# Patient Record
Sex: Male | Born: 1970 | Race: Black or African American | Hispanic: No | Marital: Married | Smoking: Never smoker
Health system: Southern US, Community
[De-identification: ages and names within clinical notes are randomized; demographics above are authoritative.]

## PROBLEM LIST (undated history)

## (undated) DIAGNOSIS — I10 Essential (primary) hypertension: Secondary | ICD-10-CM

## (undated) HISTORY — DX: Essential (primary) hypertension: I10

## (undated) HISTORY — PX: JOINT REPLACEMENT: SHX530

## (undated) HISTORY — PX: OTHER SURGICAL HISTORY: SHX169

---

## 2006-08-12 ENCOUNTER — Emergency Department: Payer: Self-pay | Admitting: Emergency Medicine

## 2011-01-14 ENCOUNTER — Encounter: Payer: Self-pay | Admitting: *Deleted

## 2011-01-14 ENCOUNTER — Emergency Department (INDEPENDENT_AMBULATORY_CARE_PROVIDER_SITE_OTHER): Payer: BC Managed Care – PPO

## 2011-01-14 ENCOUNTER — Emergency Department (HOSPITAL_BASED_OUTPATIENT_CLINIC_OR_DEPARTMENT_OTHER)
Admission: EM | Admit: 2011-01-14 | Discharge: 2011-01-15 | Disposition: A | Discharge status: Home / Self Care | Payer: BC Managed Care – PPO | Attending: Emergency Medicine | Admitting: Emergency Medicine

## 2011-01-14 ENCOUNTER — Other Ambulatory Visit: Payer: Self-pay

## 2011-01-14 DIAGNOSIS — R55 Syncope and collapse: Secondary | ICD-10-CM | POA: Insufficient documentation

## 2011-01-14 DIAGNOSIS — R079 Chest pain, unspecified: Secondary | ICD-10-CM

## 2011-01-14 DIAGNOSIS — R002 Palpitations: Secondary | ICD-10-CM

## 2011-01-14 LAB — COMPREHENSIVE METABOLIC PANEL
AST: 34 U/L (ref 0–37)
BUN: 17 mg/dL (ref 6–23)
CO2: 27 mEq/L (ref 19–32)
Calcium: 10.4 mg/dL (ref 8.4–10.5)
Chloride: 99 mEq/L (ref 96–112)
Creatinine, Ser: 1.3 mg/dL (ref 0.50–1.35)
GFR calc Af Amer: >60 mL/min (ref >60)
GFR calc non Af Amer: >60 mL/min (ref >60)
Total Bilirubin: 0.5 mg/dL (ref 0.3–1.2)

## 2011-01-14 LAB — CARDIAC PANEL(CRET KIN+CKTOT+MB+TROPI)
CK, MB: 3.8 ng/mL (ref 0.3–4.0)
Total CK: 332 U/L — ABNORMAL HIGH (ref 7–232)

## 2011-01-14 LAB — CBC
HCT: 44.9 % (ref 39.0–52.0)
MCH: 32.4 pg (ref 26.0–34.0)
MCV: 92 fL (ref 78.0–100.0)
Platelets: 204 10*3/uL (ref 150–400)
RBC: 4.88 MIL/uL (ref 4.22–5.81)

## 2011-01-14 NOTE — ED Provider Notes (Addendum)
Scribed for Dr. Ignacia Palma, the patient was seen in room 3. This chart was scribed by Jannette Fogo. This patient's care was started at 22:33.    Chief Complaint  Patient presents with  . Near Syncope  . Palpitations    HPI Bryan Cook is a 40 y.o. male who presents to the Emergency Department complaining of sudden onset of left arm paresthesia, dizziness, and palpitations starting one hour prior to arrival. Patient was sitting at his desk at work, then suddenly developed left arm numbness and tingling. He then noted palpitations, mild blurry vision, felt dizzy, and had a near-syncopal episode. Patient states symptoms lasted for 5-10 minutes then improved. He called his spouse who noted that his heart was "breating fast". Patient also reports a "funny feeling in his head" but denies any associated chest pain or shortness of breath.  Currently patient reports that the tingling sensation still persists in his left pinky. He denies a history of similar symptoms or recent stressors. He denies any recent illnesses, rashes, or tick bites. History of borderline hypertension but denies diabetes mellitus or other chronic medical problems. There are no other associated symptoms and no other alleviating or aggravating factors.    PAST MEDICAL HISTORY:  Borderline hypertension    PAST SURGICAL HISTORY:  Past Surgical History  Procedure Date  . Joint replacement (Knee surgery) 1994     MEDICATIONS: Previous Medications   FISH OIL-OMEGA-3 FATTY ACIDS 1000 MG CAPSULE    Take 2 g by mouth daily.     GINKGO BILOBA (GINKOBA PO)    Take 1 capsule by mouth daily.       ALLERGIES:  Allergies as of 01/14/2011  . (No Known Allergies)     FAMILY HISTORY:  Father is 66 years old Mother has diabetes and kidney failure, on Dialysis  Siblings: brother has hypertension    SOCIAL HISTORY: Accompanied to the ED by spouse Married History  Substance Use Topics  . Smoking status: Never Smoker   . Smokeless  tobacco: Not on file  . Alcohol Use: No    Review of Systems  Constitutional: Negative.  Negative for fever.  HENT: Negative.  Negative for ear pain and sore throat.   Respiratory: Negative.  Negative for shortness of breath.   Cardiovascular: Negative.  Negative for chest pain.  Gastrointestinal: Negative.  Negative for nausea, vomiting and diarrhea.  Genitourinary: Negative.  Negative for urgency, decreased urine volume and difficulty urinating.  Musculoskeletal: Negative.   Skin: Negative.  Negative for rash.       No tick bites   Neurological: Positive for dizziness. Negative for seizures.  Hematological: Negative.   Psychiatric/Behavioral: Negative.   All other systems reviewed and are negative.    Physical Exam  BP 151/101  Pulse 76  Temp(Src) 98.4 F (36.9 C) (Oral)  Resp 16  Wt 240 lb (108.863 kg)  SpO2 100%  Physical Exam  Constitutional: He is oriented to person, place, and time. He appears well-developed and well-nourished.  HENT:  Head: Normocephalic and atraumatic.  Mouth/Throat: Oropharynx is clear and moist.  Eyes: Conjunctivae are normal. Pupils are equal, round, and reactive to light.  Neck: Normal range of motion. Neck supple.  Cardiovascular: Normal rate, regular rhythm, normal heart sounds and intact distal pulses.   No murmur heard. Pulmonary/Chest: Effort normal and breath sounds normal.  Abdominal: Soft. Bowel sounds are normal. He exhibits no distension. There is no tenderness.  Musculoskeletal: Normal range of motion. He exhibits no edema and no  tenderness.  Neurological: He is alert and oriented to person, place, and time. Coordination normal.       Sensation intact.   Skin: Skin is warm and dry. No rash noted.  Psychiatric: He has a normal mood and affect.    OTHER DATA REVIEWED: Nursing notes, vital signs, and past medical records reviewed.   DIAGNOSTIC STUDIES: Oxygen Saturation is 100% on room air, normal by my interpretation.     Cardiac Monitor: normal sinus rhythm at 72 bpm. No ectopy.  ED ECG REPORT  Date: 01/14/2011  Rate:75 Rhythm: normal sinus rhythm  QRS Axis: normal  Intervals: normal  ST/T Wave abnormalities: normal  Conduction Disutrbances:none  Left ventricular hypertrophy  Narrative Interpretation: Borderline EKG showing LVH  Old EKG Reviewed: none available    LABS / RADIOLOGY:  Results for orders placed during the hospital encounter of 01/14/11  CARDIAC PANEL(CRET KIN+CKTOT+MB+TROPI)      Component Value Range   Total CK 332 (*) 7 - 232 (U/L)   CK, MB 3.8  0.3 - 4.0 (ng/mL)   Troponin I <0.30  <0.30 (ng/mL)   Relative Index 1.1  0.0 - 2.5   CBC      Component Value Range   WBC 9.2  4.0 - 10.5 (K/uL)   RBC 4.88  4.22 - 5.81 (MIL/uL)   Hemoglobin 15.8  13.0 - 17.0 (g/dL)   HCT 16.1  09.6 - 04.5 (%)   MCV 92.0  78.0 - 100.0 (fL)   MCH 32.4  26.0 - 34.0 (pg)   MCHC 35.2  30.0 - 36.0 (g/dL)   RDW 40.9  81.1 - 91.4 (%)   Platelets 204  150 - 400 (K/uL)  COMPREHENSIVE METABOLIC PANEL      Component Value Range   Sodium 138  135 - 145 (mEq/L)   Potassium 3.8  3.5 - 5.1 (mEq/L)   Chloride 99  96 - 112 (mEq/L)   CO2 27  19 - 32 (mEq/L)   Glucose, Bld 110 (*) 70 - 99 (mg/dL)   BUN 17  6 - 23 (mg/dL)   Creatinine, Ser 7.82  0.50 - 1.35 (mg/dL)   Calcium 95.6  8.4 - 10.5 (mg/dL)   Total Protein 8.0  6.0 - 8.3 (g/dL)   Albumin 4.6  3.5 - 5.2 (g/dL)   AST 34  0 - 37 (U/L)   ALT 53  0 - 53 (U/L)   Alkaline Phosphatase 84  39 - 117 (U/L)   Total Bilirubin 0.5  0.3 - 1.2 (mg/dL)   GFR calc non Af Amer >60  >60 (mL/min)   GFR calc Af Amer >60  >60 (mL/min)    CXR: 1 View; Interpreted by Radiologist Dr. Camelia Phenes and reviewed by me:  Normal     ED COURSE / COORDINATION OF CARE: 12:02 AM - ED physician discussed results and diagnosis with the patient and family.   Mr. Pichon had physical exam, laboratory testing, EKG, and chest x-ray to check on him for palpitations and paresthesias  the head and left hand. Fortunately his tests were all negative. His blood pressure was mildly elevated. I advised him that he would need followup with his family doctor, a Dr. Terance Hart at [facility] in [place], [place].  If his blood pressure remains elevated, he will need treatment for hypertension.   IMPRESSION: Palpitations   PLAN: Discharge  The patient is to return the emergency department if there is any worsening of symptoms. I have reviewed the  discharge instructions with the patient.    CONDITION ON DISCHARGE: Improved    MEDICATIONS GIVEN IN THE E.D.  Medications  fish oil-omega-3 fatty acids 1000 MG capsule (not administered)  Ginkgo Biloba (GINKOBA PO) (not administered)     DISCHARGE MEDICATIONS: New Prescriptions   No medications on file     ED Course  Procedures    .I personally performed the services described in this documentation, which was scribed in my presence. The recorded information has been reviewed and considered. Osvaldo Human, M.D.       Carleene Cooper III, MD 01/15/11 1610  Carleene Cooper III, MD 01/15/11 [id number]  Carleene Cooper III, MD 02/21/11 [phone]

## 2011-01-14 NOTE — ED Notes (Signed)
Pt c/o palpitations and dizziness with left arm numbness while sitting at desk at work x 1 hr ago.

## 2013-03-13 IMAGING — CR DG CHEST 1V PORT
2 series · 2 of 2 positions shown · non-contrast
Comparison: None.

CLINICAL DATA: Chest pain

PORTABLE CHEST - 1 VIEW

[view not recorded (1 of 2)]
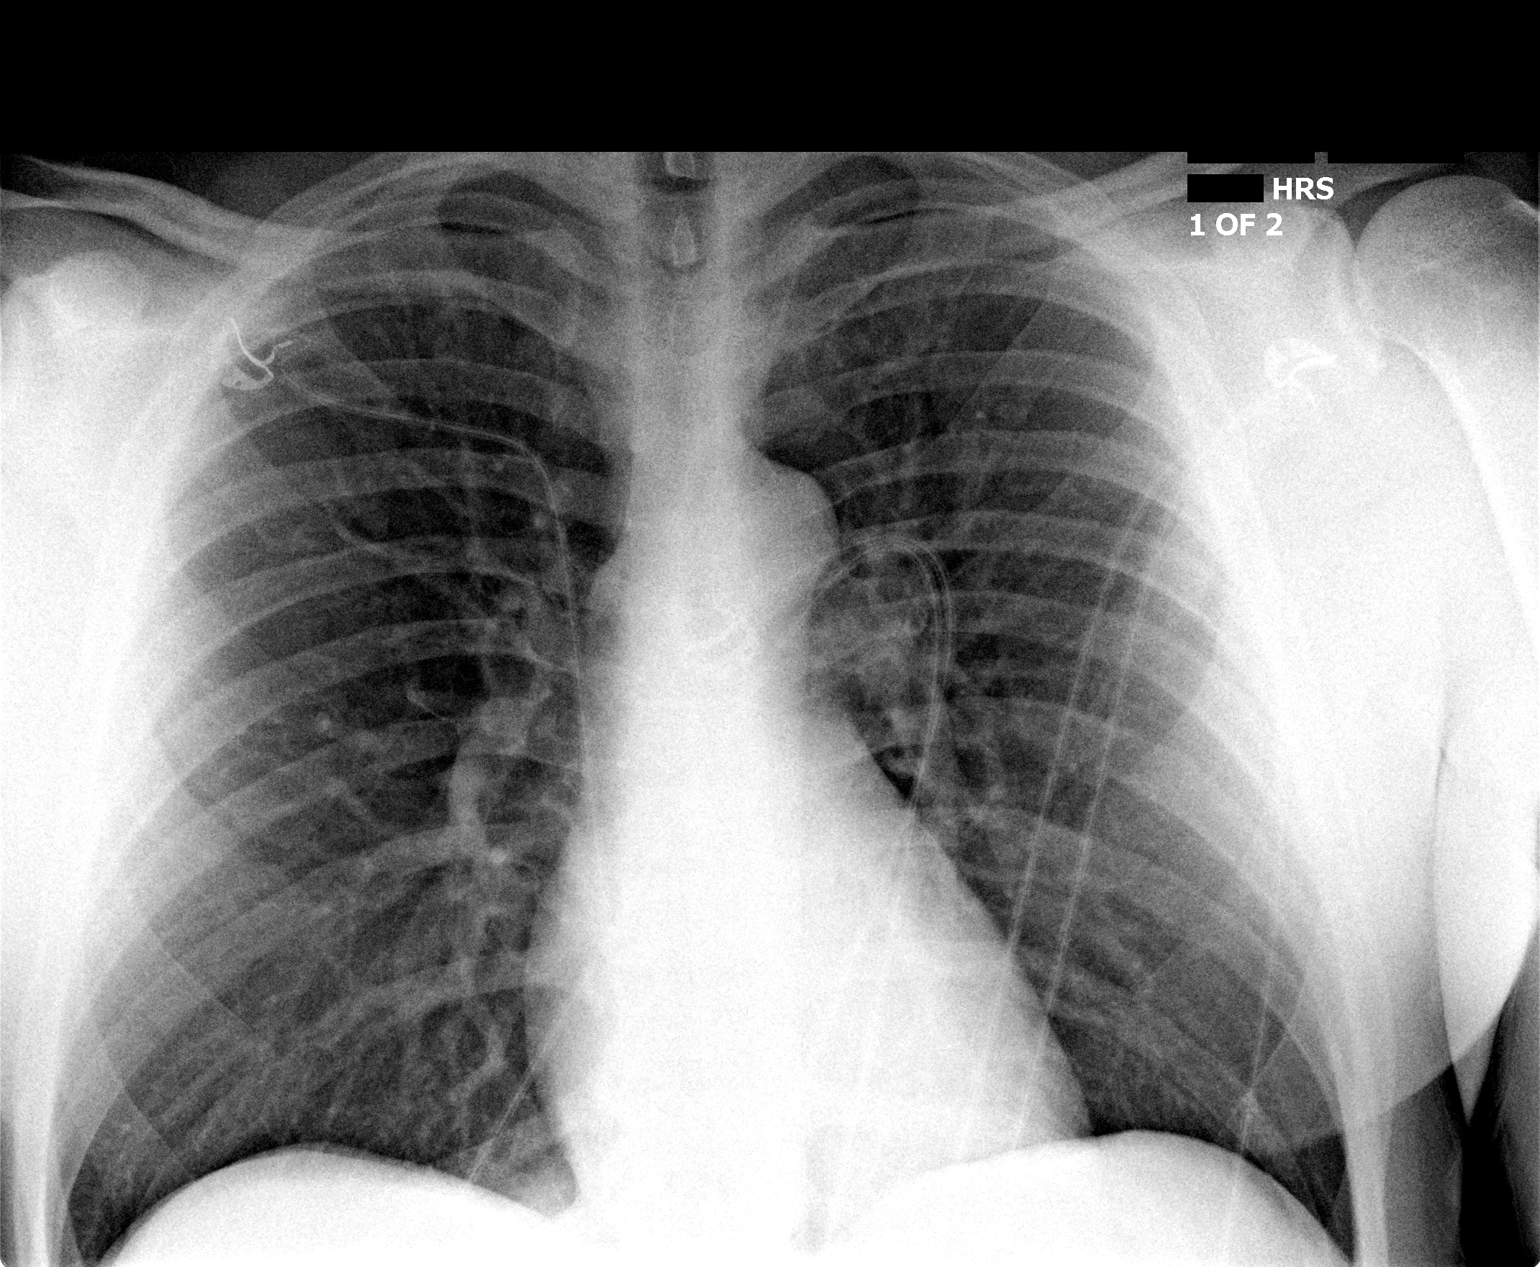

[view not recorded (2 of 2)]
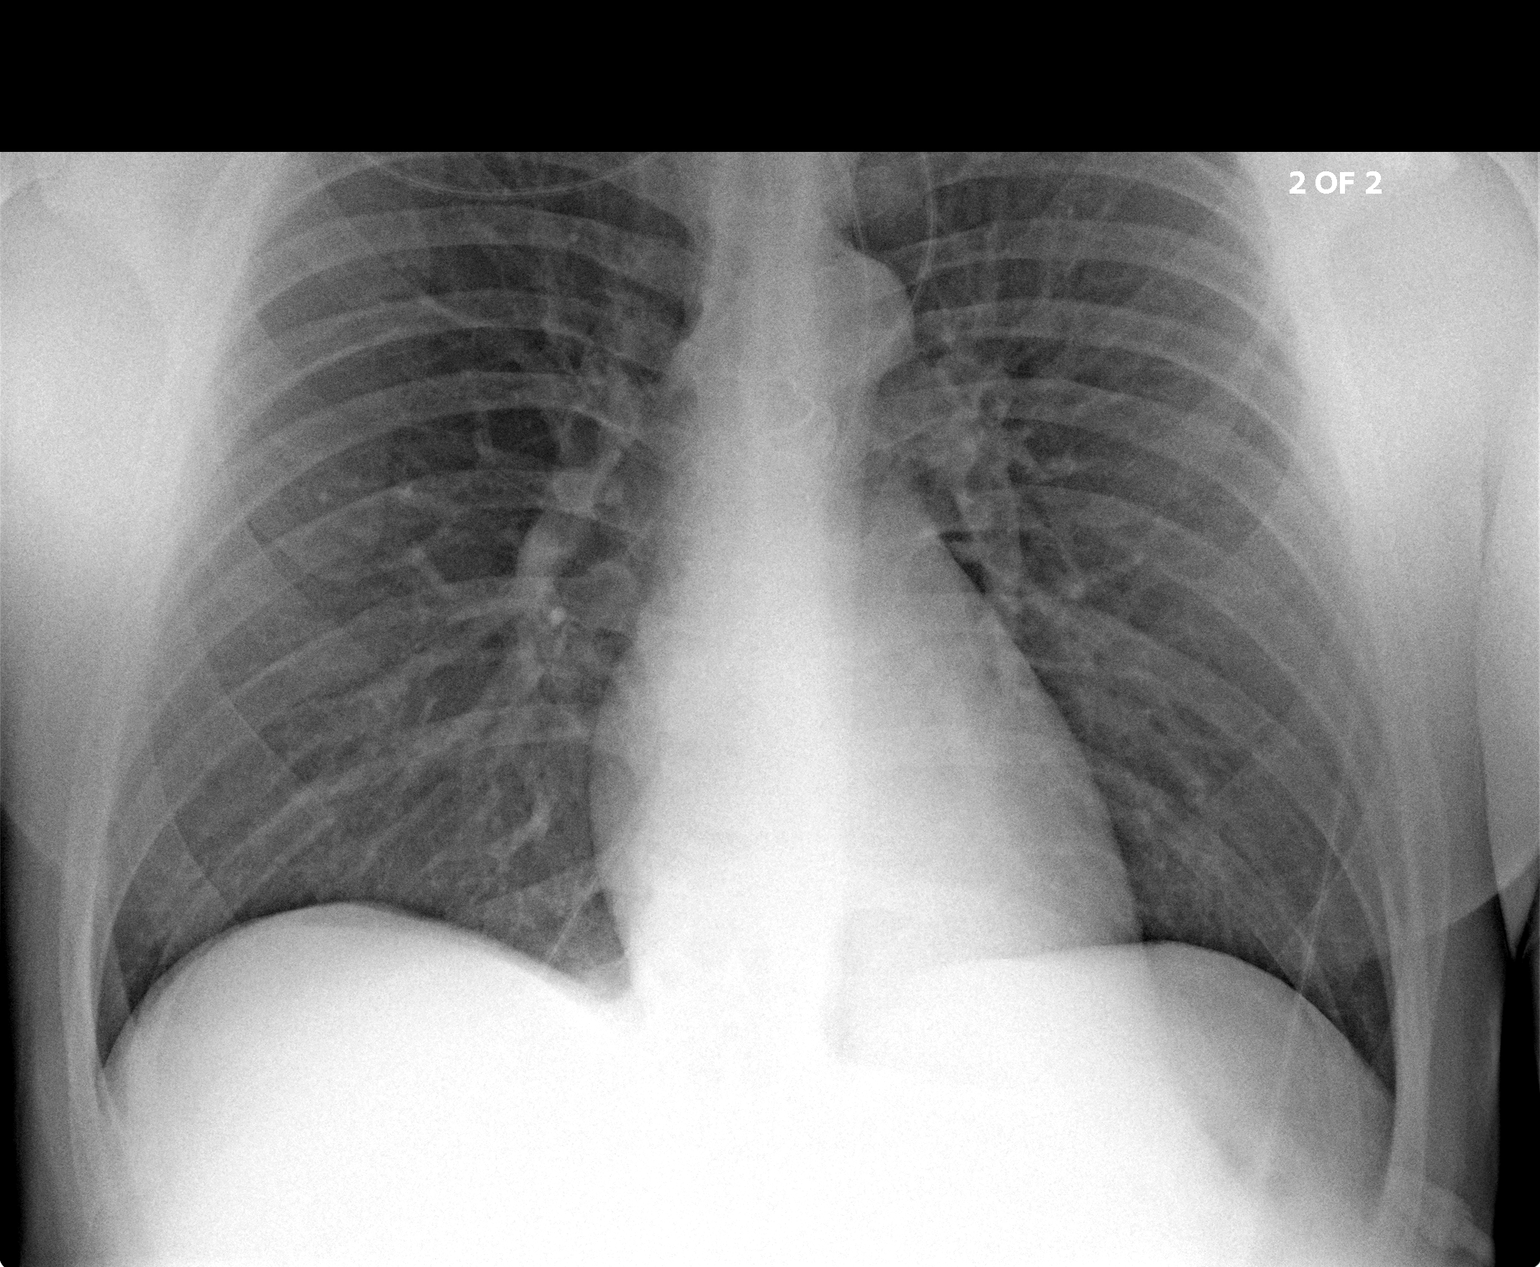

[2 of 2 positions shown; findings below may reference images not displayed]

FINDINGS: Cardiac and mediastinal contours are normal.  Pulmonary
vascularity is normal.  Lungs are clear without infiltrate or
effusion.
IMPRESSION: Normal

## 2015-05-08 ENCOUNTER — Encounter: Payer: Self-pay | Admitting: Emergency Medicine

## 2015-05-08 ENCOUNTER — Emergency Department
Admission: EM | Admit: 2015-05-08 | Discharge: 2015-05-08 | Disposition: A | Discharge status: Home / Self Care | Payer: BLUE CROSS/BLUE SHIELD | Attending: Emergency Medicine | Admitting: Emergency Medicine

## 2015-05-08 DIAGNOSIS — M5442 Lumbago with sciatica, left side: Secondary | ICD-10-CM | POA: Diagnosis not present

## 2015-05-08 DIAGNOSIS — Z79899 Other long term (current) drug therapy: Secondary | ICD-10-CM | POA: Diagnosis not present

## 2015-05-08 DIAGNOSIS — M545 Low back pain: Secondary | ICD-10-CM | POA: Diagnosis present

## 2015-05-08 MED ORDER — CYCLOBENZAPRINE HCL 10 MG PO TABS: 10.0000 mg | ORAL_TABLET | Freq: Three times a day (TID) | ORAL | Status: DC | PRN

## 2015-05-08 MED ORDER — DEXAMETHASONE SODIUM PHOSPHATE 10 MG/ML IJ SOLN
10.0000 mg | Freq: Once | INTRAMUSCULAR | Status: AC
  Administered 2015-05-08: 10 mg via INTRAMUSCULAR
  Filled 2015-05-08: qty 1

## 2015-05-08 MED ORDER — MELOXICAM 15 MG PO TABS: 15.0000 mg | ORAL_TABLET | Freq: Every day | ORAL | Status: DC

## 2015-05-08 MED ORDER — PREDNISONE 10 MG (21) PO TBPK: ORAL_TABLET | ORAL | Status: DC

## 2015-05-08 NOTE — ED Notes (Signed)
States he developed lower back pain about 1 week ago. Pain is mainly on the left side and now radiates into foot. Describes pain as burning type pain  Denies any injury

## 2015-05-08 NOTE — ED Notes (Signed)
Pt to ed with c/o back pain that started about 1 week ago,  Now with pain in left hip radiating down leg into toes and foot on left side.

## 2015-05-08 NOTE — ED Provider Notes (Signed)
[facility] Emergency Department Provider Note ____________________________________________  Time seen: Approximately 8:35 AM  I have reviewed the triage vital signs and the nursing notes.   HISTORY  Chief Complaint Back Pain and Hip Pain   HPI Bryan Cook is a 44 y.o. male who presents to the emergency department for evaluation of left lower back pain that radiates into the back of his leg and into his foot. Pain started after turning over in bed about a week ago. Initially pain was only in the lower back and has gradually radiated down to the foot. No previous back injury. No similar symptoms in the past. No loss of bowel or bladder control. No relief with tylenol.  History reviewed. No pertinent past medical history.  There are no active problems to display for this patient.   Past Surgical History  Procedure Laterality Date  . Joint replacement      Current Outpatient Rx  Name  Route  Sig  Dispense  Refill  . cyclobenzaprine (FLEXERIL) 10 MG tablet   Oral   Take 1 tablet (10 mg total) by mouth 3 (three) times daily as needed for muscle spasms.   30 tablet   0   . fish oil-omega-3 fatty acids 1000 MG capsule   Oral   Take 2 g by mouth daily.           . Ginkgo Biloba (GINKOBA PO)   Oral   Take 1 capsule by mouth daily.           . meloxicam (MOBIC) 15 MG tablet   Oral   Take 1 tablet (15 mg total) by mouth daily.   30 tablet   2   . predniSONE (STERAPRED UNI-PAK 21 TAB) 10 MG (21) TBPK tablet      Starting on 05/09/15 Take 6 tablets on day 1 Take 5 tablets on day 2 Take 4 tablets on day 3 Take 3 tablets on day 4 Take 2 tablets on day 5 Take 1 tablet on day 6   21 tablet   0     Allergies Review of patient's allergies indicates no known allergies.  History reviewed. No pertinent family history.  Social History Social History  Substance Use Topics  . Smoking status: Never Smoker   . Smokeless tobacco: None  . Alcohol  Use: No    Review of Systems Constitutional: No recent illness. Eyes: No visual changes. ENT: No sore throat. Cardiovascular: Denies chest pain or palpitations. Respiratory: Denies shortness of breath. Gastrointestinal: No abdominal pain.  Genitourinary: Negative for dysuria. Musculoskeletal: Pain in left lower back with radiation into right leg to foot Skin: Negative for rash. Neurological: Negative for headaches, focal weakness or numbness. 10-point ROS otherwise negative.  ____________________________________________   PHYSICAL EXAM:  VITAL SIGNS: ED Triage Vitals  Enc Vitals Group     BP 05/08/15 0822 149/102 mmHg     Pulse Rate 05/08/15 0822 67     Resp 05/08/15 0822 20     Temp 05/08/15 0822 98.4 F (36.9 C)     Temp Source 05/08/15 0822 Oral     SpO2 05/08/15 0822 100 %     Weight 05/08/15 0822 258 lb (117.028 kg)     Height 05/08/15 0822 6\' 2"  (1.88 m)     Head Cir --      Peak Flow --      Pain Score 05/08/15 0823 5     Pain Loc --      Pain  Edu? --      Excl. in GC? --     Constitutional: Alert and oriented. Well appearing and in no acute distress. Eyes: Conjunctivae are normal. EOMI. Head: Atraumatic. Nose: No congestion/rhinnorhea. Neck: No stridor.  Respiratory: Normal respiratory effort.   Musculoskeletal: Straight leg raise positive at 40* on left. Neurologic:  Normal speech and language. No gross focal neurologic deficits are appreciated. Speech is normal. No gait instability. Skin:  Skin is warm, dry and intact. Atraumatic. Psychiatric: Mood and affect are normal. Speech and behavior are normal.  ____________________________________________   LABS (all labs ordered are listed, but only abnormal results are displayed)  Labs Reviewed - No data to display ____________________________________________  RADIOLOGY  Not indicated. ____________________________________________   PROCEDURES  Procedure(s) performed:  None   ____________________________________________   INITIAL IMPRESSION / ASSESSMENT AND PLAN / ED COURSE  Pertinent labs & imaging results that were available during my care of the patient were reviewed by me and considered in my medical decision making (see chart for details).  Patient was advised to follow up with the orthopedic doctor for symptoms that are not improving over the week. He was advised to return to the ER for symptoms that change or worsen if unable to schedule an appointment. ____________________________________________   FINAL CLINICAL IMPRESSION(S) / ED DIAGNOSES  Final diagnoses:  Acute back pain with sciatica, left       Chinita Pester, FNP 05/08/15 0930  Emily Filbert, MD 05/08/15 1323

## 2020-12-23 ENCOUNTER — Other Ambulatory Visit: Payer: Self-pay

## 2020-12-23 ENCOUNTER — Emergency Department
Admission: EM | Admit: 2020-12-23 | Discharge: 2020-12-23 | Disposition: A | Discharge status: Home / Self Care | Payer: BC Managed Care – PPO | Attending: Emergency Medicine | Admitting: Emergency Medicine

## 2020-12-23 DIAGNOSIS — S61200A Unspecified open wound of right index finger without damage to nail, initial encounter: Secondary | ICD-10-CM | POA: Diagnosis not present

## 2020-12-23 DIAGNOSIS — Z9660 Presence of unspecified orthopedic joint implant: Secondary | ICD-10-CM | POA: Diagnosis not present

## 2020-12-23 DIAGNOSIS — Z23 Encounter for immunization: Secondary | ICD-10-CM | POA: Insufficient documentation

## 2020-12-23 DIAGNOSIS — W274XXA Contact with kitchen utensil, initial encounter: Secondary | ICD-10-CM | POA: Diagnosis not present

## 2020-12-23 DIAGNOSIS — S60940A Unspecified superficial injury of right index finger, initial encounter: Secondary | ICD-10-CM | POA: Diagnosis present

## 2020-12-23 DIAGNOSIS — S61209A Unspecified open wound of unspecified finger without damage to nail, initial encounter: Secondary | ICD-10-CM

## 2020-12-23 MED ORDER — TRAMADOL HCL 50 MG PO TABS
50.0000 mg | ORAL_TABLET | Freq: Three times a day (TID) | ORAL | 0 refills | Status: AC | PRN
Start: 2020-12-23 — End: 2021-12-23

## 2020-12-23 MED ORDER — TETANUS-DIPHTH-ACELL PERTUSSIS 5-2.5-18.5 LF-MCG/0.5 IM SUSY
0.5000 mL | PREFILLED_SYRINGE | Freq: Once | INTRAMUSCULAR | Status: AC
  Administered 2020-12-23: 0.5 mL via INTRAMUSCULAR
  Filled 2020-12-23: qty 0.5

## 2020-12-23 MED ORDER — SULFAMETHOXAZOLE-TRIMETHOPRIM 800-160 MG PO TABS: 1.0000 | ORAL_TABLET | Freq: Two times a day (BID) | ORAL | 0 refills | Status: DC

## 2020-12-23 NOTE — ED Triage Notes (Signed)
Pt reports that he was using a slicer and sliced the tip of his finger. Pt is holding pressure on his finger, and has the tip of finger in a zip lock bag.

## 2020-12-23 NOTE — ED Provider Notes (Signed)
[facility] Emergency Department Provider Note ____________________________________________  Time seen: 1830  I have reviewed the triage vital signs and the nursing notes.  HISTORY  Chief Complaint  Laceration   HPI Bryan Cook is a 50 y.o. male presents to the ER today with complaint of laceration to his right index finger.  He reports this occurred just prior to arrival, slicing his finger on a mandolin.  He has been unable to control the bleeding.  He has been applying pressure.  He is unsure when his last tetanus was.  History reviewed. No pertinent past medical history.  There are no problems to display for this patient.   Past Surgical History:  Procedure Laterality Date   JOINT REPLACEMENT      Prior to Admission medications   Medication Sig Start Date End Date Taking? Authorizing Provider  sulfamethoxazole-trimethoprim (BACTRIM DS) 800-160 MG tablet Take 1 tablet by mouth 2 (two) times daily. 12/23/20  Yes Ivanell Deshotel, Salvadore Oxford, NP  traMADol (ULTRAM) 50 MG tablet Take 1 tablet (50 mg total) by mouth every 8 (eight) hours as needed. 12/23/20 12/23/21 Yes Paden Senger, Salvadore Oxford, NP  cyclobenzaprine (FLEXERIL) 10 MG tablet Take 1 tablet (10 mg total) by mouth 3 (three) times daily as needed for muscle spasms. 05/08/15   Triplett, Rulon Eisenmenger B, FNP  fish oil-omega-3 fatty acids 1000 MG capsule Take 2 g by mouth daily.      [provider]  Ginkgo Biloba (GINKOBA PO) Take 1 capsule by mouth daily.      [provider]  meloxicam (MOBIC) 15 MG tablet Take 1 tablet (15 mg total) by mouth daily. 05/08/15   Triplett, Rulon Eisenmenger B, FNP  predniSONE (STERAPRED UNI-PAK 21 TAB) 10 MG (21) TBPK tablet Starting on 05/09/15 Take 6 tablets on day 1 Take 5 tablets on day 2 Take 4 tablets on day 3 Take 3 tablets on day 4 Take 2 tablets on day 5 Take 1 tablet on day 6 05/08/15   Kem Boroughs B, FNP    Allergies Patient has no known allergies.  History reviewed. No pertinent  family history.  Social History Social History   Tobacco Use   Smoking status: Never  Substance Use Topics   Alcohol use: No   Drug use: No    Review of Systems  Constitutional: Negative for fever. Cardiovascular: Negative for chest pain or chest tightness. Respiratory: Negative for cough or shortness of breath. Skin: Positive for laceration to right index finger. Neurological: Negative for focal weakness, tingling or numbness. ____________________________________________  PHYSICAL EXAM:  VITAL SIGNS: ED Triage Vitals [12/23/20 1814]  Enc Vitals Group     BP (!) 157/109     Pulse Rate 74     Resp 18     Temp 98.7 F (37.1 C)     Temp Source Oral     SpO2 98 %     Weight 245 lb (111.1 kg)     Height 6\' 2"  (1.88 m)     Head Circumference      Peak Flow      Pain Score 3     Pain Loc      Pain Edu?      Excl. in GC?     Constitutional: Alert and oriented. Well appearing and in no distress. Head: Normocephalic. Cardiovascular: Normal rate, regular rhythm.  Radial pulses 2+ bilaterally. Respiratory: Normal respiratory effort. No wheezes/rales/rhonchi. Musculoskeletal: Normal flexion, extension of the right index finger. Neurologic:  Normal speech and language.  No gross focal neurologic deficits are appreciated. Skin: Avulsion of the pad of the finger on the lateral edge of the right index finger with nail damage. ____________________________________________    INITIAL IMPRESSION / ASSESSMENT AND PLAN / ED COURSE  Avulsion of Right Index Finger:  D/w patient, no laceration to repair Pressure and surgicel applied Wound covered with Vaseline guaze, 2x2 and kerlex Encouraged elevation Will have him follow up with ortho as an outpatient RX for Tramadol 50 mg TID prn RX for Septra DS 1 tab PO BID x 7 days Work note provided ____________________________________________  FINAL CLINICAL IMPRESSION(S) / ED DIAGNOSES  Final diagnoses:  Avulsion of finger,  initial encounter        Lorre Munroe, NP 12/23/20 1912    Sharman Cheek, MD 12/23/20 2356

## 2020-12-23 NOTE — Discharge Instructions (Addendum)
You were seen today for an avulsion of your right ring finger.  We were able to control bleeding with Surgicel and pressure.  You received a tetanus injection today.  I put you on antibiotics to take twice daily for the next 7 days.  I have prescribed you pain medicine to take every 8 hours as needed.  Please call orthopedics for follow-up on Monday.

## 2023-06-30 DIAGNOSIS — S31109A Unspecified open wound of abdominal wall, unspecified quadrant without penetration into peritoneal cavity, initial encounter: Secondary | ICD-10-CM | POA: Diagnosis not present

## 2023-12-08 ENCOUNTER — Ambulatory Visit: Admitting: Family Medicine

## 2023-12-08 ENCOUNTER — Encounter: Payer: Self-pay | Admitting: Family Medicine

## 2023-12-08 VITALS — BP 134/86 | HR 73 | Resp 16 | Ht 74.0 in | Wt 270.0 lb

## 2023-12-08 DIAGNOSIS — R7989 Other specified abnormal findings of blood chemistry: Secondary | ICD-10-CM

## 2023-12-08 DIAGNOSIS — E785 Hyperlipidemia, unspecified: Secondary | ICD-10-CM | POA: Diagnosis not present

## 2023-12-08 DIAGNOSIS — Z1159 Encounter for screening for other viral diseases: Secondary | ICD-10-CM

## 2023-12-08 DIAGNOSIS — Z6834 Body mass index (BMI) 34.0-34.9, adult: Secondary | ICD-10-CM | POA: Diagnosis not present

## 2023-12-08 DIAGNOSIS — E66811 Obesity, class 1: Secondary | ICD-10-CM | POA: Diagnosis not present

## 2023-12-08 DIAGNOSIS — Z0000 Encounter for general adult medical examination without abnormal findings: Secondary | ICD-10-CM

## 2023-12-08 DIAGNOSIS — Z1211 Encounter for screening for malignant neoplasm of colon: Secondary | ICD-10-CM | POA: Diagnosis not present

## 2023-12-08 DIAGNOSIS — R030 Elevated blood-pressure reading, without diagnosis of hypertension: Secondary | ICD-10-CM | POA: Insufficient documentation

## 2023-12-08 DIAGNOSIS — R202 Paresthesia of skin: Secondary | ICD-10-CM | POA: Diagnosis not present

## 2023-12-08 DIAGNOSIS — G245 Blepharospasm: Secondary | ICD-10-CM

## 2023-12-08 NOTE — Assessment & Plan Note (Signed)
 BP today slightly elevated, he would like to first monitor BP at home after seeing if cuff is accurate and he would like to also work on DASH Reviewed BP goals and info given to pt today He will bring in cuff for nurse visit  Monitor home BP readings, if average >130/80 recommend starting medications in addition to diet and lifestyle efforts Currently asymptomatic Blood pressure reading recently at the pharmacy was systolic blood pressure 150s

## 2023-12-08 NOTE — Progress Notes (Signed)
 Name: Bryan Cook   MRN: [id number]    DOB: 1970-11-06   Date:12/08/2023       Progress Note  Chief Complaint  Patient presents with   Establish Care   Hypertension    Has been told it's high before, not on any medications    Subjective:   Bryan Cook is a 53 y.o. male, presents to clinic to establish care He's been told BP high but never put on medications His BP was high at [facility] pharmacy -SPB was 150's so he called and made an appt Home BP readings he's not sure if his is accurate so he has not checked Today bp 134/86  Last PCP that I can see through EMR was [facility] in [place] in 2022  Left acl repair many years ago 1996  Other concerns include some tingling/discomfort of hands/wrists, and sometimes fingers getting cramped up Cannot say how long this has been going on for he does not have any swelling 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   No current outpatient medications on file.  Patient Active Problem List   Diagnosis Date Noted   Elevated BP without diagnosis of hypertension 12/08/2023   Class 1 obesity with body mass index (BMI) of 34.0 to 34.9 in adult 12/08/2023    Past Surgical History:  Procedure Laterality Date   left acl Left    ACL repair in 1996    Family History  Problem Relation Age of Onset   Diabetes Mother     Social History   Socioeconomic History   Marital status: Married    Spouse name: anita   Number of children: Not on file   Years of education: Not on file   Highest education level: Not on file  Occupational History   Not on file  Tobacco Use   Smoking status: Never   Smokeless tobacco: Never  Vaping Use   Vaping status: Never Used  Substance and Sexual Activity   Alcohol use: No   Drug use: No   Sexual activity: Yes    Birth control/protection: None    Comment: Bryan  hysterectomy  Other Topics Concern   Not on file  Social History Narrative   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    Bryan Cook   Two kids living at home 54 y/o daughter, son 83    Social Drivers of Health   Financial Resource Strain: Low Risk  (12/08/2023)   Overall Financial Resource Strain (CARDIA)    Difficulty of Paying Living Expenses: Not hard at all  Food Insecurity: No Food Insecurity (12/08/2023)   Hunger Vital Sign    Worried About Running Out of Food in the Last Year: Never true    Ran Out of Food in the Last Year: Never true  Transportation Needs: No Transportation Needs (12/08/2023)   PRAPARE - Administrator, Civil Service (Medical): No    Lack of Transportation (Non-Medical): No  Physical Activity: Inactive (12/08/2023)   Exercise Vital Sign    Days of Exercise per Week: 0 days    Minutes of Exercise per Session: 0 min  Stress: No Stress Concern Present (12/08/2023)   [employer] of Occupational Health - Occupational Stress Questionnaire    Feeling of Stress: Not at all  Social Connections: Moderately Integrated (12/08/2023)   Social Connection and Isolation  Panel    Frequency of Communication with Friends and Family: More than three times a week    Frequency of Social Gatherings with Friends and Family: More than three times a week    Attends Religious Services: More than 4 times per year    Active Member of Clubs or Organizations: No    Attends Banker Meetings: Never    Marital Status: Married       No Known Allergies  Health Maintenance  Topic Date Due   HIV Screening  Never done   Hepatitis C Screening  Never done   Colonoscopy  Never done   COVID-19 Vaccine (1 - 2024-25 season) 12/21/2023 (Originally 02/16/2023)   Zoster Vaccines- Shingrix (1 of 2) 03/06/2024 (Originally 01/12/2022)   INFLUENZA VACCINE  01/16/2024   DTaP/Tdap/Td (4 - Td or Tdap) 12/24/2030   HPV VACCINES  Aged Out   Meningococcal B Vaccine  Aged Out    Chart  Review Today: I personally reviewed active problem list, medication list, allergies, family history, social history, health maintenance, notes from last encounter, lab results, imaging with the patient/caregiver today.   Review of Systems  Constitutional: Negative.   HENT: Negative.    Eyes: Negative.   Respiratory: Negative.    Cardiovascular: Negative.   Gastrointestinal: Negative.   Endocrine: Negative.   Genitourinary: Negative.   Musculoskeletal: Negative.   Skin: Negative.   Allergic/Immunologic: Negative.   Neurological: Negative.   Hematological: Negative.   Psychiatric/Behavioral: Negative.    All other systems reviewed and are negative.      Objective:   Vitals:   12/08/23 1320  BP: 134/86  Pulse: 73  Resp: 16  SpO2: 98%  Weight: 270 lb (122.5 kg)  Height: 6' 2 (1.88 m)    Body mass index is 34.67 kg/m.  Physical Exam Vitals and nursing note reviewed.  Constitutional:      General: He is not in acute distress.    Appearance: Normal appearance. He is well-developed. He is obese. He is not ill-appearing, toxic-appearing or diaphoretic.  HENT:     Head: Normocephalic and atraumatic.     Right Ear: External ear normal.     Left Ear: External ear normal.     Nose: Nose normal.   Eyes:     General: No scleral icterus.       Right eye: No discharge.        Left eye: No discharge.     Conjunctiva/sclera: Conjunctivae normal.   Neck:     Trachea: No tracheal deviation.   Cardiovascular:     Rate and Rhythm: Normal rate and regular rhythm.     Pulses: Normal pulses.     Heart sounds: Normal heart sounds.  Pulmonary:     Effort: Pulmonary effort is normal. No respiratory distress.     Breath sounds: Normal breath sounds. No stridor. No wheezing, rhonchi or rales.   Musculoskeletal:     Right wrist: Normal. No lacerations, tenderness or bony tenderness. Normal range of motion.     Left wrist: Normal. No lacerations, tenderness or bony tenderness.  Normal range of motion.     Right hand: Normal. No swelling, deformity, tenderness or bony tenderness. Normal range of motion. Normal strength. Normal sensation. Normal capillary refill. Normal pulse.     Left hand: Normal. No swelling, deformity, tenderness or bony tenderness. Normal range of motion. Normal strength. Normal sensation. Normal capillary refill. Normal pulse.   Skin:    General: Skin is warm  and dry.     Findings: No rash.   Neurological:     Mental Status: He is alert.     Motor: No abnormal muscle tone.     Coordination: Coordination normal.     Gait: Gait normal.   Psychiatric:        Mood and Affect: Mood normal.        Behavior: Behavior normal.      Functional Status Survey: Is the patient deaf or have difficulty hearing?: No Does the patient have difficulty seeing, even when wearing glasses/contacts?: No Does the patient have difficulty concentrating, remembering, or making decisions?: No Does the patient have difficulty walking or climbing stairs?: No Does the patient have difficulty dressing or bathing?: No Does the patient have difficulty doing errands alone such as visiting a doctor's office or shopping?: No     Assessment & Plan:   Problem List Items Addressed This Visit     Elevated BP without diagnosis of hypertension   BP today slightly elevated, he would like to first monitor BP at home after seeing if cuff is accurate and he would like to also work on DASH Reviewed BP goals and info given to pt today He will bring in cuff for nurse visit  Monitor home BP readings, if average >130/80 recommend starting medications in addition to diet and lifestyle efforts Currently asymptomatic Blood pressure reading recently at the pharmacy was systolic blood pressure 150s      Relevant Orders   Comprehensive Metabolic Panel (CMET)   Class 1 obesity with body mass index (BMI) of 34.0 to 34.9 in adult   Obese with history of elevated LDL and elevated blood  pressure Screening labs obtained today      Relevant Orders   CBC with Differential/Platelet   Comprehensive Metabolic Panel (CMET)   Lipid Profile   Hemoglobin A1c   Other Visit Diagnoses       Encounter for medical examination to establish care    -  Primary   reviewed his past PCP OV and labs as avaiable through care everywhere     Screening for viral disease       due for HIV and hep C screen, low risk hx   Relevant Orders   Hepatitis C Antibody   HIV antibody (with reflex)     Colon cancer screening       Relevant Orders   Ambulatory referral to Gastroenterology     Elevated LFTs       on chart but last labs LFTs are in normal range   Relevant Orders   Comprehensive Metabolic Panel (CMET)     Hyperlipidemia, unspecified hyperlipidemia type       high LDL with prior screening lipids in 2021 with past PCP   Relevant Orders   Comprehensive Metabolic Panel (CMET)   Lipid Profile     Paresthesias       bilateral hands, neg phalen's and tinel's sign today, no pain/swelling or decreased sensation, pt can let us  know if sx return and I will refer to hand   Relevant Orders   Comprehensive Metabolic Panel (CMET)   TSH     Eye twitch       discussed various triggers and causes, none on exam today   Relevant Orders   Comprehensive Metabolic Panel (CMET)          Return for return in the next 1-3 months for CPE.   Michelene Cower, PA-C 12/08/23 1:47 PM

## 2023-12-08 NOTE — Assessment & Plan Note (Signed)
 Obese with history of elevated LDL and elevated blood pressure Screening labs obtained today

## 2023-12-09 ENCOUNTER — Ambulatory Visit: Payer: Self-pay | Admitting: Family Medicine

## 2023-12-09 LAB — CBC WITH DIFFERENTIAL/PLATELET
Absolute Lymphocytes: 2102 {cells}/uL (ref 850–3900)
Absolute Monocytes: 456 {cells}/uL (ref 200–950)
Basophils Absolute: 29 {cells}/uL (ref 0–200)
Basophils Relative: 0.6 %
Eosinophils Absolute: 29 {cells}/uL (ref 15–500)
Eosinophils Relative: 0.6 %
HCT: 45.6 % (ref 38.5–50.0)
Hemoglobin: 14.8 g/dL (ref 13.2–17.1)
MCH: 31.4 pg (ref 27.0–33.0)
MCHC: 32.5 g/dL (ref 32.0–36.0)
MCV: 96.6 fL (ref 80.0–100.0)
MPV: 10.2 fL (ref 7.5–12.5)
Monocytes Relative: 9.5 %
Neutro Abs: 2184 {cells}/uL (ref 1500–7800)
Neutrophils Relative %: 45.5 %
Platelets: 208 10*3/uL (ref 140–400)
RBC: 4.72 10*6/uL (ref 4.20–5.80)
RDW: 13.1 % (ref 11.0–15.0)
Total Lymphocyte: 43.8 %
WBC: 4.8 10*3/uL (ref 3.8–10.8)

## 2023-12-09 LAB — COMPREHENSIVE METABOLIC PANEL WITH GFR
AG Ratio: 1.8 (calc) (ref 1.0–2.5)
ALT: 16 U/L (ref 9–46)
AST: 15 U/L (ref 10–35)
Albumin: 4.4 g/dL (ref 3.6–5.1)
Alkaline phosphatase (APISO): 80 U/L (ref 35–144)
BUN: 14 mg/dL (ref 7–25)
CO2: 24 mmol/L (ref 20–32)
Calcium: 9.6 mg/dL (ref 8.6–10.3)
Chloride: 107 mmol/L (ref 98–110)
Creat: 1.19 mg/dL (ref 0.70–1.30)
Globulin: 2.4 g/dL (ref 1.9–3.7)
Glucose, Bld: 92 mg/dL (ref 65–99)
Potassium: 4.2 mmol/L (ref 3.5–5.3)
Sodium: 140 mmol/L (ref 135–146)
Total Bilirubin: 0.7 mg/dL (ref 0.2–1.2)
Total Protein: 6.8 g/dL (ref 6.1–8.1)
eGFR: 74 mL/min/{1.73_m2} (ref >=60)

## 2023-12-09 LAB — HEMOGLOBIN A1C
Hgb A1c MFr Bld: 6.1 % — ABNORMAL HIGH (ref <5.7)
Mean Plasma Glucose: 128 mg/dL
eAG (mmol/L): 7.1 mmol/L

## 2023-12-09 LAB — LIPID PANEL
Cholesterol: 191 mg/dL (ref <200)
HDL: 40 mg/dL (ref >=40)
LDL Cholesterol (Calc): 138 mg/dL — ABNORMAL HIGH
Non-HDL Cholesterol (Calc): 151 mg/dL — ABNORMAL HIGH (ref <130)
Total CHOL/HDL Ratio: 4.8 (calc) (ref <5.0)
Triglycerides: 43 mg/dL (ref <150)

## 2023-12-09 LAB — TSH: TSH: 1.41 m[IU]/L (ref 0.40–4.50)

## 2023-12-09 LAB — HIV ANTIBODY (ROUTINE TESTING W REFLEX): HIV 1&2 Ab, 4th Generation: NONREACTIVE

## 2023-12-09 LAB — HEPATITIS C ANTIBODY: Hepatitis C Ab: NONREACTIVE
